# Patient Record
Sex: Male | Born: 1970 | Race: White | Hispanic: Yes | Marital: Married | Smoking: Never smoker
Health system: Southern US, Community
[De-identification: ages and names within clinical notes are randomized; demographics above are authoritative.]

## PROBLEM LIST (undated history)

## (undated) DIAGNOSIS — F419 Anxiety disorder, unspecified: Secondary | ICD-10-CM

## (undated) HISTORY — PX: VASOTOMY: SUR1432

## (undated) HISTORY — DX: Anxiety disorder, unspecified: F41.9

---

## 2008-10-27 ENCOUNTER — Emergency Department (HOSPITAL_COMMUNITY): Admission: EM | Admit: 2008-10-27 | Discharge: 2008-10-27 | Payer: Self-pay | Admitting: Emergency Medicine

## 2016-08-20 ENCOUNTER — Emergency Department (HOSPITAL_COMMUNITY)
Admission: EM | Admit: 2016-08-20 | Discharge: 2016-08-20 | Disposition: A | Discharge status: Home / Self Care | Payer: Self-pay | Attending: Emergency Medicine | Admitting: Emergency Medicine

## 2016-08-20 ENCOUNTER — Encounter (HOSPITAL_COMMUNITY): Payer: Self-pay

## 2016-08-20 ENCOUNTER — Emergency Department (HOSPITAL_COMMUNITY): Payer: Self-pay

## 2016-08-20 DIAGNOSIS — R200 Anesthesia of skin: Secondary | ICD-10-CM | POA: Insufficient documentation

## 2016-08-20 DIAGNOSIS — R0789 Other chest pain: Secondary | ICD-10-CM | POA: Insufficient documentation

## 2016-08-20 DIAGNOSIS — R0602 Shortness of breath: Secondary | ICD-10-CM | POA: Insufficient documentation

## 2016-08-20 LAB — HEPATIC FUNCTION PANEL
ALK PHOS: 54 U/L (ref 38–126)
ALT: 36 U/L (ref 17–63)
AST: 32 U/L (ref 15–41)
Albumin: 4.2 g/dL (ref 3.5–5.0)
TOTAL PROTEIN: 6.8 g/dL (ref 6.5–8.1)
Total Bilirubin: 0.7 mg/dL (ref 0.3–1.2)

## 2016-08-20 LAB — CBC
HEMATOCRIT: 46 % (ref 39.0–52.0)
Hemoglobin: 15.8 g/dL (ref 13.0–17.0)
MCH: 32.3 pg (ref 26.0–34.0)
MCHC: 34.3 g/dL (ref 30.0–36.0)
MCV: 94.1 fL (ref 78.0–100.0)
PLATELETS: 175 10*3/uL (ref 150–400)
RBC: 4.89 MIL/uL (ref 4.22–5.81)
RDW: 12.4 % (ref 11.5–15.5)
WBC: 5.3 10*3/uL (ref 4.0–10.5)

## 2016-08-20 LAB — BASIC METABOLIC PANEL
Anion gap: 8 (ref 5–15)
BUN: 16 mg/dL (ref 6–20)
CHLORIDE: 103 mmol/L (ref 101–111)
CO2: 26 mmol/L (ref 22–32)
CREATININE: 0.96 mg/dL (ref 0.61–1.24)
Calcium: 9.1 mg/dL (ref 8.9–10.3)
GFR calc Af Amer: >60 mL/min (ref >60)
GLUCOSE: 106 mg/dL — AB (ref 65–99)
Potassium: 3.9 mmol/L (ref 3.5–5.1)
SODIUM: 137 mmol/L (ref 135–145)

## 2016-08-20 LAB — LIPASE, BLOOD: LIPASE: 34 U/L (ref 11–51)

## 2016-08-20 LAB — BRAIN NATRIURETIC PEPTIDE: B Natriuretic Peptide: 8.6 pg/mL (ref 0.0–100.0)

## 2016-08-20 LAB — D-DIMER, QUANTITATIVE: D-Dimer, Quant: <0.27 ug/mL-FEU (ref 0.00–0.50)

## 2016-08-20 LAB — TROPONIN I

## 2016-08-20 LAB — I-STAT TROPONIN, ED: Troponin i, poc: 0 ng/mL (ref 0.00–0.08)

## 2016-08-20 NOTE — ED Triage Notes (Signed)
Patient complains of several weeks of several months of facial numbness and chest pain intermittently for 3 weeks. Was seen by his MD and all labs normal per patient. States that the CP is sharp with no radiation. Minimal pain on assessment

## 2016-08-20 NOTE — ED Provider Notes (Signed)
[facility]-EMERGENCY DEPT Provider Note   CSN: [id number] Arrival date & time: 08/20/16  1424  By signing my name below, I, Douglas Stark, attest that this documentation has been prepared under the direction and in the presence of Glynn Octave, MD.  Electronically Signed: Octavia Stark, ED Scribe. 08/20/16. 4:53 PM.    History   Chief Complaint Chief Complaint  Patient presents with  . Chest Pain  . Numbness    The history is provided by the patient. No language interpreter was used.   HPI Comments: Douglas Stark is a 45 y.o. male who presents to the Emergency Department complaining of intermittent, gradual worsening, moderate, non-radiating left sided chest pain x 2 weeks. He notes each episodes lasts from 5-15 minutes. Associated occasional shortness of breath and bilateral facial numbness below his eyes that he has had for ~ 3 years. Pt states that his chest pain is modified when driving or being still. There are no aggravating factors. He was seen by his PCP last week where he had labs done and they came back normal. There are no known injury noted that may have caused his chest pain. Denies diaphoresis, nausea, vomiting, abdominal pain, loss of appetite, numbness or weakness in upper or lower extremities, urinary symptoms, back pain, hx of MI or stents placed, trouble talking, and trouble swallowing. Pt is a non-smoker.  History reviewed. No pertinent past medical history.  There are no active problems to display for this patient.   History reviewed. No pertinent surgical history.     Home Medications    Prior to Admission medications   Not on File    Family History No family history on file.  Social History Social History  Substance Use Topics  . Smoking status: Never Smoker  . Smokeless tobacco: Never Used  . Alcohol use Yes     Allergies   Review of patient's allergies indicates no known allergies.   Review of Systems Review of Systems  A complete 10  system review of systems was obtained and all systems are negative except as noted in the HPI and PMH.   Physical Exam Updated Vital Signs BP 120/60 (BP Location: Left Arm)   Pulse (!) 57   Temp 97.9 F (36.6 C) (Oral)   Resp 18   Ht 5\' 7"  (1.702 m)   Wt 160 lb (72.6 kg)   SpO2 100%   BMI 25.06 kg/m   Physical Exam  Constitutional: He is oriented to person, place, and time. He appears well-developed and well-nourished. No distress.  HENT:  Head: Normocephalic and atraumatic.  Mouth/Throat: Oropharynx is clear and moist. No oropharyngeal exudate.  Eyes: Conjunctivae and EOM are normal. Pupils are equal, round, and reactive to light.  Neck: Normal range of motion. Neck supple.  No meningismus.  Cardiovascular: Normal rate, regular rhythm, normal heart sounds and intact distal pulses.   No murmur heard. Pulmonary/Chest: Effort normal and breath sounds normal. No respiratory distress.  Abdominal: Soft. There is no tenderness. There is no rebound and no guarding.  Musculoskeletal: Normal range of motion. He exhibits tenderness. He exhibits no edema.  Left sided chest tenderness partly reproducible  Neurological: He is alert and oriented to person, place, and time. No cranial nerve deficit. He exhibits normal muscle tone. Coordination normal.  5/5 strength throughout. CN 2-12 intact. Equal radial pulses and grip strength. Subjective sensation intact.  Skin: Skin is warm.  Psychiatric: He has a normal mood and affect. His behavior is normal.  Nursing note and  vitals reviewed.    ED Treatments / Results  DIAGNOSTIC STUDIES: Oxygen Saturation is 100% on RA, normal by my interpretation.  COORDINATION OF CARE:  4:44 PM Discussed treatment plan with pt at bedside and pt agreed to plan.  Labs (all labs ordered are listed, but only abnormal results are displayed) Labs Reviewed  BASIC METABOLIC PANEL - Abnormal; Notable for the following:       Result Value   Glucose, Bld 106 (*)     All other components within normal limits  HEPATIC FUNCTION PANEL - Abnormal; Notable for the following:    Bilirubin, Direct <0.1 (*)    All other components within normal limits  CBC  LIPASE, BLOOD  D-DIMER, QUANTITATIVE (NOT AT [facility])  BRAIN NATRIURETIC PEPTIDE  TROPONIN I  I-STAT TROPOININ, ED    EKG  EKG Interpretation  Date/Time:  Wednesday August 20 2016 14:36:57 EDT Ventricular Rate:  56 PR Interval:  164 QRS Duration: 90 QT Interval:  414 QTC Calculation: 399 R Axis:   98 Text Interpretation:  Sinus bradycardia Rightward axis Borderline ECG No previous ECGs available Confirmed by Manus GunningANCOUR  MD, Avalynne Diver [phone]([id number]) on 08/20/2016 4:43:41 PM       Radiology Dg Chest 2 View  Result Date: 08/20/2016 CLINICAL DATA:  Pt c/o central chest pain and sinus numbness x 3 weeks. No known hx of heart or lung problems. Pt is a nonsmoker. EXAM: CHEST  2 VIEW COMPARISON:  None. FINDINGS: Midline trachea. Normal heart size and mediastinal contours. No pleural effusion or pneumothorax. Clear lungs. IMPRESSION: No acute cardiopulmonary disease. Electronically Signed   By: Jeronimo GreavesKyle  Talbot M.D.   On: 08/20/2016 15:47    Procedures Procedures (including critical care time)  Medications Ordered in ED Medications - No data to display   Initial Impression / Assessment and Plan / ED Course  I have reviewed the triage vital signs and the nursing notes.  Pertinent labs & imaging results that were available during my care of the patient were reviewed by me and considered in my medical decision making (see chart for details).  Clinical Course  Patient with intermittent left-sided chest pain for the past 2 weeks. Pain comes and goes lasting several minutes at a time. Does not radiate. No shortness of breath, nausea, vomiting or diaphoresis. Patient also complains of "numbness under my eyes" intermittently for the past 3 years.  EKG nsr, without acute changes. Troponin negative, chest x-ray negative.  D-dimer negative.  Low suspicion for ACS. Heart score is 1.  Patient's numbness underneath his eyes has been ongoing for 3 years in his bilateral. He is not having any at this time. Not consistent with stroke or TIA.  Troponin negative 2. Low suspicion for ACS. Patient's facial numbness has been ongoing for 3 years and is bilateral. Not Consistent with stroke or TIA. Follow up with cardiology for stress test.  Return precautions discussed.    Final Clinical Impressions(s) / ED Diagnoses   Final diagnoses:  Atypical chest pain   I personally performed the services described in this documentation, which was scribed in my presence. The recorded information has been reviewed and is accurate.   New Prescriptions New Prescriptions   No medications on file     Glynn OctaveStephen Launi Asencio, MD 08/21/16 0005

## 2016-08-20 NOTE — Discharge Instructions (Signed)
There is no evidence of heart attack or blood clot in the lung. Followup with your doctor. You should see the cardiologist for a stress test. Return to the ED if you develop new or worsening symptoms.

## 2020-08-13 ENCOUNTER — Emergency Department (HOSPITAL_COMMUNITY): Payer: Self-pay

## 2020-08-13 ENCOUNTER — Other Ambulatory Visit: Payer: Self-pay

## 2020-08-13 ENCOUNTER — Emergency Department (HOSPITAL_COMMUNITY)
Admission: EM | Admit: 2020-08-13 | Discharge: 2020-08-14 | Disposition: A | Discharge status: Home / Self Care | Payer: Self-pay | Attending: Emergency Medicine | Admitting: Emergency Medicine

## 2020-08-13 ENCOUNTER — Encounter (HOSPITAL_COMMUNITY): Payer: Self-pay

## 2020-08-13 DIAGNOSIS — R519 Headache, unspecified: Secondary | ICD-10-CM | POA: Insufficient documentation

## 2020-08-13 DIAGNOSIS — R002 Palpitations: Secondary | ICD-10-CM | POA: Insufficient documentation

## 2020-08-13 LAB — BASIC METABOLIC PANEL
Anion gap: 10 (ref 5–15)
BUN: 23 mg/dL — ABNORMAL HIGH (ref 6–20)
CO2: 24 mmol/L (ref 22–32)
Calcium: 9.3 mg/dL (ref 8.9–10.3)
Chloride: 105 mmol/L (ref 98–111)
Creatinine, Ser: 1 mg/dL (ref 0.61–1.24)
GFR, Estimated: >60 mL/min (ref >60)
Glucose, Bld: 103 mg/dL — ABNORMAL HIGH (ref 70–99)
Potassium: 3.8 mmol/L (ref 3.5–5.1)
Sodium: 139 mmol/L (ref 135–145)

## 2020-08-13 LAB — CBC
HCT: 43.6 % (ref 39.0–52.0)
Hemoglobin: 14.8 g/dL (ref 13.0–17.0)
MCH: 32.2 pg (ref 26.0–34.0)
MCHC: 33.9 g/dL (ref 30.0–36.0)
MCV: 95 fL (ref 80.0–100.0)
Platelets: 185 10*3/uL (ref 150–400)
RBC: 4.59 MIL/uL (ref 4.22–5.81)
RDW: 11.9 % (ref 11.5–15.5)
WBC: 6.4 10*3/uL (ref 4.0–10.5)
nRBC: 0 % (ref 0.0–0.2)

## 2020-08-13 LAB — TROPONIN I (HIGH SENSITIVITY)
Troponin I (High Sensitivity): 2 ng/L (ref <18)
Troponin I (High Sensitivity): <2 ng/L (ref <18)

## 2020-08-13 NOTE — ED Triage Notes (Signed)
Pt reports that he has chest palpations that woke him up from his sleep along with a headache. Pt reports that this has been going on for a month but tonight it is worse. Denies other cardiac symptoms.

## 2020-08-14 NOTE — Discharge Instructions (Addendum)
You were seen today for palpitations.  Your work-up is reassuring.  Stop taking Benadryl at night to sleep to see if this helps at all.  Follow-up with cardiology for Holter monitoring.  Avoid excessive caffeine.

## 2020-08-14 NOTE — ED Provider Notes (Signed)
[facility] EMERGENCY DEPARTMENT Provider Note   CSN: [id number] Arrival date & time: 08/13/20  0252     History Chief Complaint  Patient presents with  . Chest Pain    Douglas Stark is a 49 y.o. male.  HPI     This is a 49 year old male with no reported past medical history who presents with palpitations and headache.  Patient reports that he awoke yesterday morning from sleep with feeling like his heart was beating fast.  He states that the sensation radiated into his head.  He has had this previously and more frequently over the last month but it was worse yesterday morning.  He did not have any chest pain or shortness of breath at that time.  Patient states that recently he has actually tried to decrease his caffeine use.  However, he does take 100 mg of Benadryl nightly.  He has not previously had a bad reaction to this.  No known history of blood clots, leg swelling.  Denies recent fevers or cough.  Currently he is asymptomatic.  History reviewed. No pertinent past medical history.  There are no problems to display for this patient.   History reviewed. No pertinent surgical history.     No family history on file.  Social History   Tobacco Use  . Smoking status: Never Smoker  . Smokeless tobacco: Never Used  Substance Use Topics  . Alcohol use: Yes  . Drug use: Not on file    Home Medications Prior to Admission medications   Not on File    Allergies    Patient has no known allergies.  Review of Systems   Review of Systems  Constitutional: Negative for fever.  Respiratory: Negative for cough and shortness of breath.   Cardiovascular: Positive for palpitations. Negative for chest pain.  Gastrointestinal: Negative for abdominal pain, nausea and vomiting.  Genitourinary: Negative for dysuria.  Neurological: Positive for headaches. Negative for light-headedness.  All other systems reviewed and are negative.   Physical Exam Updated  Vital Signs BP (!) 119/95   Pulse (!) 56   Temp 97.7 F (36.5 C)   Resp 18   Ht 1.727 m (5\' 8" )   Wt 75.8 kg   SpO2 100%   BMI 25.39 kg/m   Physical Exam Vitals and nursing note reviewed.  Constitutional:      Appearance: He is well-developed.  HENT:     Head: Normocephalic and atraumatic.  Eyes:     Pupils: Pupils are equal, round, and reactive to light.  Cardiovascular:     Rate and Rhythm: Normal rate and regular rhythm.     Heart sounds: Normal heart sounds. No murmur heard.   Pulmonary:     Effort: Pulmonary effort is normal. No respiratory distress.     Breath sounds: Normal breath sounds. No wheezing.  Abdominal:     General: Bowel sounds are normal.     Palpations: Abdomen is soft.     Tenderness: There is no abdominal tenderness. There is no rebound.  Musculoskeletal:     Cervical back: Neck supple.     Right lower leg: No tenderness. No edema.     Left lower leg: No tenderness. No edema.  Lymphadenopathy:     Cervical: No cervical adenopathy.  Skin:    General: Skin is warm and dry.  Neurological:     Mental Status: He is alert and oriented to person, place, and time.  Psychiatric:  Mood and Affect: Mood normal.     ED Results / Procedures / Treatments   Labs (all labs ordered are listed, but only abnormal results are displayed) Labs Reviewed  BASIC METABOLIC PANEL - Abnormal; Notable for the following components:      Result Value   Glucose, Bld 103 (*)    BUN 23 (*)    All other components within normal limits  CBC  TROPONIN I (HIGH SENSITIVITY)  TROPONIN I (HIGH SENSITIVITY)    EKG EKG Interpretation  Date/Time:  Monday August 13 2020 03:05:38 EDT Ventricular Rate:  59 PR Interval:  148 QRS Duration: 78 QT Interval:  390 QTC Calculation: 386 R Axis:   87 Text Interpretation: Sinus bradycardia Otherwise normal ECG Rightward axis When compared with ECG of 08/20/2016, No significant change was found Confirmed by Dione Booze  ([id number]) on 08/13/2020 4:45:17 AM   Radiology DG Chest 2 View  Result Date: 08/13/2020 CLINICAL DATA:  Chest palpitations and headache. Symptoms going on for a month but worse tonight. EXAM: CHEST - 2 VIEW COMPARISON:  08/20/2016 FINDINGS: Heart size and pulmonary vascularity are normal. Calcified granulomas in the right upper lung and right hilum. No airspace disease or consolidation in the lungs. No pleural effusions. No pneumothorax. Mediastinal contours appear intact. IMPRESSION: No active cardiopulmonary disease. Electronically Signed   By: Burman Nieves M.D.   On: 08/13/2020 03:20    Procedures Procedures (including critical care time)  Medications Ordered in ED Medications - No data to display  ED Course  I have reviewed the triage vital signs and the nursing notes.  Pertinent labs & imaging results that were available during my care of the patient were reviewed by me and considered in my medical decision making (see chart for details).    MDM Rules/Calculators/A&P                          Patient presents with palpitations.  Denies associated chest pain or shortness of breath.  He has been in the waiting room for greater than 20 hours.  He is overall nontoxic-appearing and vital signs reviewed.  These are reassuring including blood pressure, temperature, and pulse rate.  EKG shows sinus bradycardia without evidence of arrhythmia or acute ischemia.  Troponin x2 is negative.  Doubt ACS.  Given no shortness of breath, chest pain, or hypoxia, would have low suspicion for PE and he is PERC negative.  This sounds more like palpitations.  Given the doses of Benadryl that he takes at night, he could be having an anticholinergic effect of palpitations.  Low suspicion for infectious etiology and chest x-ray shows no evidence of pneumothorax or pneumonia.  He is currently symptom-free.  I have recommended that he discontinue Benadryl use at night and follow-up with cardiology for Holter  monitoring.  Patient is agreeable to plan.  After history, exam, and medical workup I feel the patient has been appropriately medically screened and is safe for discharge home. Pertinent diagnoses were discussed with the patient. Patient was given return precautions.  Final Clinical Impression(s) / ED Diagnoses Final diagnoses:  Palpitations    Rx / DC Orders ED Discharge Orders    None       Kelsye Loomer, Mayer Masker, MD 08/14/20 [phone]

## 2020-08-31 ENCOUNTER — Other Ambulatory Visit: Payer: Self-pay

## 2020-08-31 ENCOUNTER — Ambulatory Visit (INDEPENDENT_AMBULATORY_CARE_PROVIDER_SITE_OTHER): Payer: Self-pay | Admitting: Cardiology

## 2020-08-31 ENCOUNTER — Encounter: Payer: Self-pay | Admitting: Cardiology

## 2020-08-31 ENCOUNTER — Telehealth: Payer: Self-pay | Admitting: Radiology

## 2020-08-31 VITALS — BP 124/72 | HR 70 | Ht 68.0 in | Wt 167.0 lb

## 2020-08-31 DIAGNOSIS — R002 Palpitations: Secondary | ICD-10-CM

## 2020-08-31 DIAGNOSIS — Z7189 Other specified counseling: Secondary | ICD-10-CM

## 2020-08-31 NOTE — Patient Instructions (Signed)
Medication Instructions:  Your Physician recommend you continue on your current medication as directed.    *If you need a refill on your cardiac medications before your next appointment, please call your pharmacy*   Lab Work: None   Testing/Procedures: Our physician has recommended that you wear an 7  DAY ZIO-PATCH monitor. The Zio patch cardiac monitor continuously records heart rhythm data for up to 14 days, this is for patients being evaluated for multiple types heart rhythms. For the first 24 hours post application, please avoid getting the Zio monitor wet in the shower or by excessive sweating during exercise. After that, feel free to carry on with regular activities. Keep soaps and lotions away from the ZIO XT Patch.   Someone from our office will call to verify address and mail monitor.    Follow-Up: At [facility], you and your health needs are our priority.  As part of our continuing mission to provide you with exceptional heart care, we have created designated Provider Care Teams.  These Care Teams include your primary Cardiologist (physician) and Advanced Practice Providers (APPs -  Physician Assistants and Nurse Practitioners) who all work together to provide you with the care you need, when you need it.  We recommend signing up for the patient portal called "MyChart".  Sign up information is provided on this After Visit Summary.  MyChart is used to connect with patients for Virtual Visits (Telemedicine).  Patients are able to view lab/test results, encounter notes, upcoming appointments, etc.  Non-urgent messages can be sent to your provider as well.   To learn more about what you can do with MyChart, go to [email or web address].    Your next appointment:   As needed  The format for your next appointment:   In Person  Provider:   Bridgette Christopher, MD  ZIO XT- Long Term Monitor Instructions   Your physician has requested you wear your ZIO patch  monitor__7_____days.   This is a single patch monitor.  [employer] supplies one patch monitor per enrollment.  Additional stickers are not available.   Please do not apply patch if you will be having a Nuclear Stress Test, Echocardiogram, Cardiac CT, MRI, or Chest Xray during the time frame you would be wearing the monitor. The patch cannot be worn during these tests.  You cannot remove and re-apply the ZIO XT patch monitor.   Your ZIO patch monitor will be sent USPS Priority mail from [employer] directly to your home address. The monitor may also be mailed to a PO BOX if home delivery is not available.   It may take 3-5 days to receive your monitor after you have been enrolled.   Once you have received you monitor, please review enclosed instructions.  Your monitor has already been registered assigning a specific monitor serial # to you.   Applying the monitor   Shave hair from upper left chest.   Hold abrader disc by orange tab.  Rub abrader in 40 strokes over left upper chest as indicated in your monitor instructions.   Clean area with 4 enclosed alcohol pads .  Use all pads to assure are is cleaned thoroughly.  Let dry.   Apply patch as indicated in monitor instructions.  Patch will be place under collarbone on left side of chest with arrow pointing upward.   Rub patch adhesive wings for 2 minutes.Remove white label marked "1".  Remove white label marked "2".  Rub patch adhesive wings for 2 additional minutes.     While looking in a mirror, press and release button in center of patch.  A small green light will flash 3-4 times .  This will be your only indicator the monitor has been turned on.     Do not shower for the first 24 hours.  You may shower after the first 24 hours.   Press button if you feel a symptom. You will hear a small click.  Record Date, Time and Symptom in the Patient Log Book.   When you are ready to remove patch, follow instructions on last 2 pages of Patient  Log Book.  Stick patch monitor onto last page of Patient Log Book.   Place Patient Log Book in Blue box.  Use locking tab on box and tape box closed securely.  The Orange and White box has prepaid postage on it.  Please place in mailbox as soon as possible.  Your physician should have your test results approximately 7 days after the monitor has been mailed back to [employer].   Call [employer] Customer Care at [phone] if you have questions regarding your ZIO XT patch monitor.  Call them immediately if you see an orange light blinking on your monitor.   If your monitor falls off in less than 4 days contact our Monitor department at [phone].  If your monitor becomes loose or falls off after 4 days call [employer] at [phone] for suggestions on securing your monitor.    

## 2020-08-31 NOTE — Progress Notes (Signed)
Cardiology Office Note:    Date:  08/31/2020   ID:  Douglas Stark, DOB [birth year], MRN [id number]  PCP:  Patient, No Pcp Per  Cardiologist:  Douglas Red, MD  Referring MD: Dr. Ross Marcus, ER  CC: new patient evaluation for palpitations  History of Present Illness:    Douglas Stark is a 49 y.o. male without significant PMH who is seen as a new consult at the request of Dr. Ross Marcus for the evaluation and management of palpitations.  ER note dated 08/14/20 reviewed. Presented with palpitations, headache  Tachycardia/palpitations: -Initial onset: several months ago, more severe recently.  -Frequency/Duration: intermittent, can be every other day or go several days without issues. Can last an entire day or sometimes longer -Associated symptoms:  Can feel a little dizzy, feels like his chest is shaking. No nausea, shortness of breath, diaphoresis -Aggravating/alleviating factors: notices that he can feel it coming on. Doesn't think the medication is helping; takes occasional benadryl to sleep. Notices more when he takes the beadryl -Syncope/near syncope: none -Prior cardiac history: none -Prior workup: none -Prior treatment: none -Possible medication interactions: none -Caffeine: has cut back -Alcohol: on occasion -Tobacco: former, was a light smoker. None in about 14 years -OTC supplements: as above, had been using PRN benadryl. Used to take 1/2 caffeine pill before he goes to the gym, no longer taking. -Comorbidities: none -Exercise level: goes to gym 4-6 days/week.  -Labs: kidney function/electrolytes, CBC reviewed. -Cardiac ROS: no chest pain, no shortness of breath, no PND, no orthopnea, no LE edema. -Family history: none that he is aware of  No past medical history on file.  No past surgical history on file.  Current Medications: Current Outpatient Medications on File Prior to Visit  Medication Sig  . hydrOXYzine (ATARAX/VISTARIL) 50 MG tablet  Take 50 mg by mouth 2 (two) times daily.   No current facility-administered medications on file prior to visit.     Allergies:   Patient has no known allergies.   Social History   Tobacco Use  . Smoking status: Never Smoker  . Smokeless tobacco: Never Used  Substance Use Topics  . Alcohol use: Yes  . Drug use: Not on file    Family History: No CV family history that he is aware of  ROS:   Please see the history of present illness.  Additional pertinent ROS: Constitutional: Negative for chills, fever, night sweats, unintentional weight loss  HENT: Negative for ear pain and hearing loss.   Eyes: Negative for loss of vision and eye pain.  Respiratory: Negative for cough, sputum, wheezing.   Cardiovascular: See HPI. Gastrointestinal: Negative for abdominal pain, melena, and hematochezia.  Genitourinary: Negative for dysuria and hematuria.  Musculoskeletal: Negative for falls and myalgias.  Skin: Negative for itching and rash.  Neurological: Negative for focal weakness, focal sensory changes and loss of consciousness.  Endo/Heme/Allergies: Does not bruise/bleed easily.     EKGs/Labs/Other Studies Reviewed:    The following studies were reviewed today: No prior cardiac studies  EKG:  EKG is personally reviewed.  The ekg ordered today demonstrates NSR at 70 bpm  Recent Labs: 08/13/2020: BUN 23; Creatinine, Ser 1.00; Hemoglobin 14.8; Platelets 185; Potassium 3.8; Sodium 139  Recent Lipid Panel No results found for: CHOL, TRIG, HDL, CHOLHDL, VLDL, LDLCALC, LDLDIRECT  Physical Exam:    VS:  BP 124/72   Pulse 70   Ht 5\' 8"  (1.727 m)   Wt 167 lb (75.8 kg)   SpO2 97%   BMI  25.39 kg/m     Wt Readings from Last 3 Encounters:  08/31/20 167 lb (75.8 kg)  08/13/20 167 lb (75.8 kg)  08/20/16 160 lb (72.6 kg)    GEN: Well nourished, well developed in no acute distress HEENT: Normal, moist mucous membranes NECK: No JVD CARDIAC: regular rhythm, normal S1 and S2, no rubs or  gallops. No murmurs. VASCULAR: Radial and DP pulses 2+ bilaterally. No carotid bruits RESPIRATORY:  Clear to auscultation without rales, wheezing or rhonchi  ABDOMEN: Soft, non-tender, non-distended MUSCULOSKELETAL:  Ambulates independently SKIN: Warm and dry, no edema NEUROLOGIC:  Alert and oriented x 3. No focal neuro deficits noted. PSYCHIATRIC:  Normal affect    ASSESSMENT:    1. Palpitation   2. Cardiac risk counseling   3. Counseling on health promotion and disease prevention    PLAN:    Palpitations -we discussed the potential causes of fast heart rates and palpitations today. Reviewed the normal electrical system of the heart. Reviewed the role of the sinus node. Reviewed the balance between resting (vagal) tone and fight or flight nervous system input. Reviewed how exercise improves vagal tone and lowers resting heart rate. Reviewed that sinus tachycardia, or elevated sinus rate, is usually secondary to something else in the body. This can include pain, stress, infection, anxiety, hormone imbalance, low blood counts, etc. Discussed that we do not typically treat sinus tachycardia by itself, and instead the focus is on finding what is driving the heart rate and treating that. We discussed that there can be other rhythm issues, from either the top or bottom of the heart, that are abnormal rhythms. Discussed how we evaluate for these. -after shared decision making, will purse 7 day Zio  Cardiac risk counseling and prevention recommendations: -recommend heart healthy/Mediterranean diet, with whole grains, fruits, vegetable, fish, lean meats, nuts, and olive oil. Limit salt. -recommend moderate walking, 3-5 times/week for 30-50 minutes each session. Aim for at least 150 minutes.week. Goal should be pace of 3 miles/hours, or walking 1.5 miles in 30 minutes -recommend avoidance of tobacco products. Avoid excess alcohol.:  -ASCVD risk score: The ASCVD Risk score Denman George(Goff DC Jr., et al., 2013)  failed to calculate for the following reasons:   Cannot find a previous HDL lab   Cannot find a previous total cholesterol lab    Plan for follow up: to be determined based on results of monitor  Douglas RedBridgette Lakitha Gordy, MD, PhD [facility]    Medication Adjustments/Labs and Tests Ordered: Current medicines are reviewed at length with the patient today.  Concerns regarding medicines are outlined above.  Orders Placed This Encounter  Procedures  . LONG TERM MONITOR (3-14 DAYS)  . EKG 12-Lead   No orders of the defined types were placed in this encounter.   Patient Instructions  Medication Instructions:  Your Physician recommend you continue on your current medication as directed.    *If you need a refill on your cardiac medications before your next appointment, please call your pharmacy*   Lab Work: None   Testing/Procedures: Our physician has recommended that you wear an 7  DAY ZIO-PATCH monitor. The Zio patch cardiac monitor continuously records heart rhythm data for up to 14 days, this is for patients being evaluated for multiple types heart rhythms. For the first 24 hours post application, please avoid getting the Zio monitor wet in the shower or by excessive sweating during exercise. After that, feel free to carry on with regular activities. Keep soaps and lotions  away from the ZIO XT Patch.   Someone from our office will call to verify address and mail monitor.     Follow-Up: At [facility], you and your health needs are our priority.  As part of our continuing mission to provide you with exceptional heart care, we have created designated Provider Care Teams.  These Care Teams include your primary Cardiologist (physician) and Advanced Practice Providers (APPs -  Physician Assistants and Nurse Practitioners) who all work together to provide you with the care you need, when you need it.  We recommend signing up for the patient portal called "MyChart".   Sign up information is provided on this After Visit Summary.  MyChart is used to connect with patients for Virtual Visits (Telemedicine).  Patients are able to view lab/test results, encounter notes, upcoming appointments, etc.  Non-urgent messages can be sent to your provider as well.   To learn more about what you can do with MyChart, go to [email or web address].    Your next appointment:   As needed  The format for your next appointment:   In Person  Provider:   Jodelle Red, MD  Christena Deem- Long Term Monitor Instructions   Your physician has requested you wear your ZIO patch monitor___7____days.   This is a single patch monitor.  [employer] supplies one patch monitor per enrollment.  Additional stickers are not available.   Please do not apply patch if you will be having a Nuclear Stress Test, Echocardiogram, Cardiac CT, MRI, or Chest Xray during the time frame you would be wearing the monitor. The patch cannot be worn during these tests.  You cannot remove and re-apply the ZIO XT patch monitor.   Your ZIO patch monitor will be sent USPS Priority mail from [facility] directly to your home address. The monitor may also be mailed to a PO BOX if home delivery is not available.   It may take 3-5 days to receive your monitor after you have been enrolled.   Once you have received you monitor, please review enclosed instructions.  Your monitor has already been registered assigning a specific monitor serial # to you.   Applying the monitor   Shave hair from upper left chest.   Hold abrader disc by orange tab.  Rub abrader in 40 strokes over left upper chest as indicated in your monitor instructions.   Clean area with 4 enclosed alcohol pads .  Use all pads to assure are is cleaned thoroughly.  Let dry.   Apply patch as indicated in monitor instructions.  Patch will be place under collarbone on left side of chest with arrow pointing upward.   Rub patch adhesive wings for 2  minutes.Remove white label marked "1".  Remove white label marked "2".  Rub patch adhesive wings for 2 additional minutes.   While looking in a mirror, press and release button in center of patch.  A small green light will flash 3-4 times .  This will be your only indicator the monitor has been turned on.     Do not shower for the first 24 hours.  You may shower after the first 24 hours.   Press button if you feel a symptom. You will hear a small click.  Record Date, Time and Symptom in the Patient Log Book.   When you are ready to remove patch, follow instructions on last 2 pages of Patient Log Book.  Stick patch monitor onto last page of Patient Log Book.   Place  Patient Log Book in [facility] box.  Use locking tab on box and tape box closed securely.  The [employer] and [employer] has [employer] on it.  Please place in mailbox as soon as possible.  Your physician should have your test results approximately 7 days after the monitor has been mailed back to [facility].   Call [facility] Customer Care at [phone] if you have questions regarding your ZIO XT patch monitor.  Call them immediately if you see an orange light blinking on your monitor.   If your monitor falls off in less than 4 days contact our Monitor department at [phone].  If your monitor becomes loose or falls off after 4 days call [employer] at [phone] for suggestions on securing your monitor.     Signed, Douglas Red, MD PhD 08/31/2020  [facility]

## 2020-08-31 NOTE — Telephone Encounter (Signed)
Enrolled patient for a 7 day Zio XT monitor to be mailed to patients home.  

## 2020-09-07 ENCOUNTER — Ambulatory Visit (INDEPENDENT_AMBULATORY_CARE_PROVIDER_SITE_OTHER): Payer: Self-pay

## 2020-09-07 DIAGNOSIS — R002 Palpitations: Secondary | ICD-10-CM

## 2020-11-08 ENCOUNTER — Encounter: Payer: Self-pay | Admitting: Cardiology

## 2021-12-18 IMAGING — CR DG CHEST 2V
2 series · 2 of 2 positions shown · non-contrast
Comparison: 08/20/2016

CLINICAL DATA: Chest palpitations and headache. Symptoms going on
for a month but worse tonight.

EXAM:
CHEST - 2 VIEW

[chest pa]
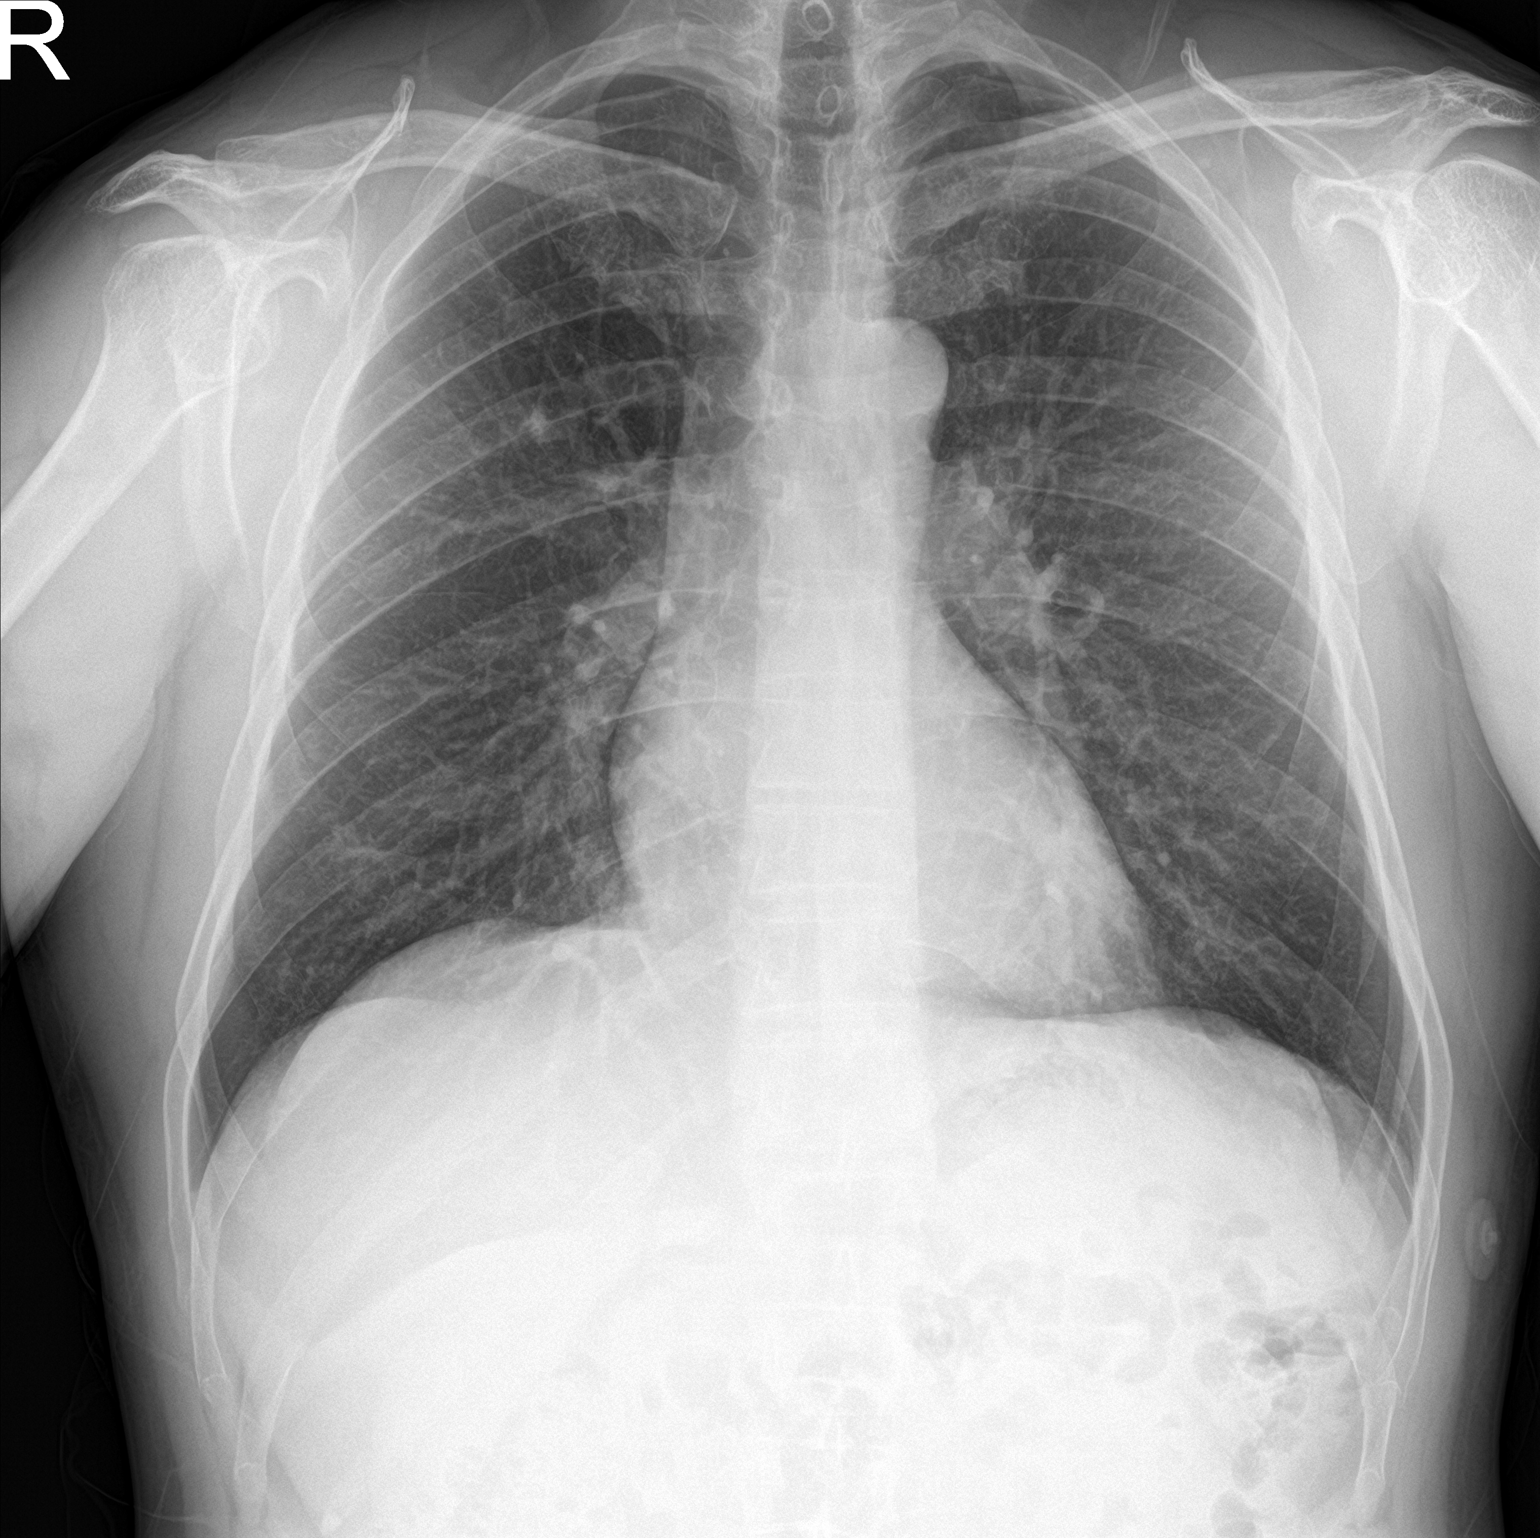

[chest lat]
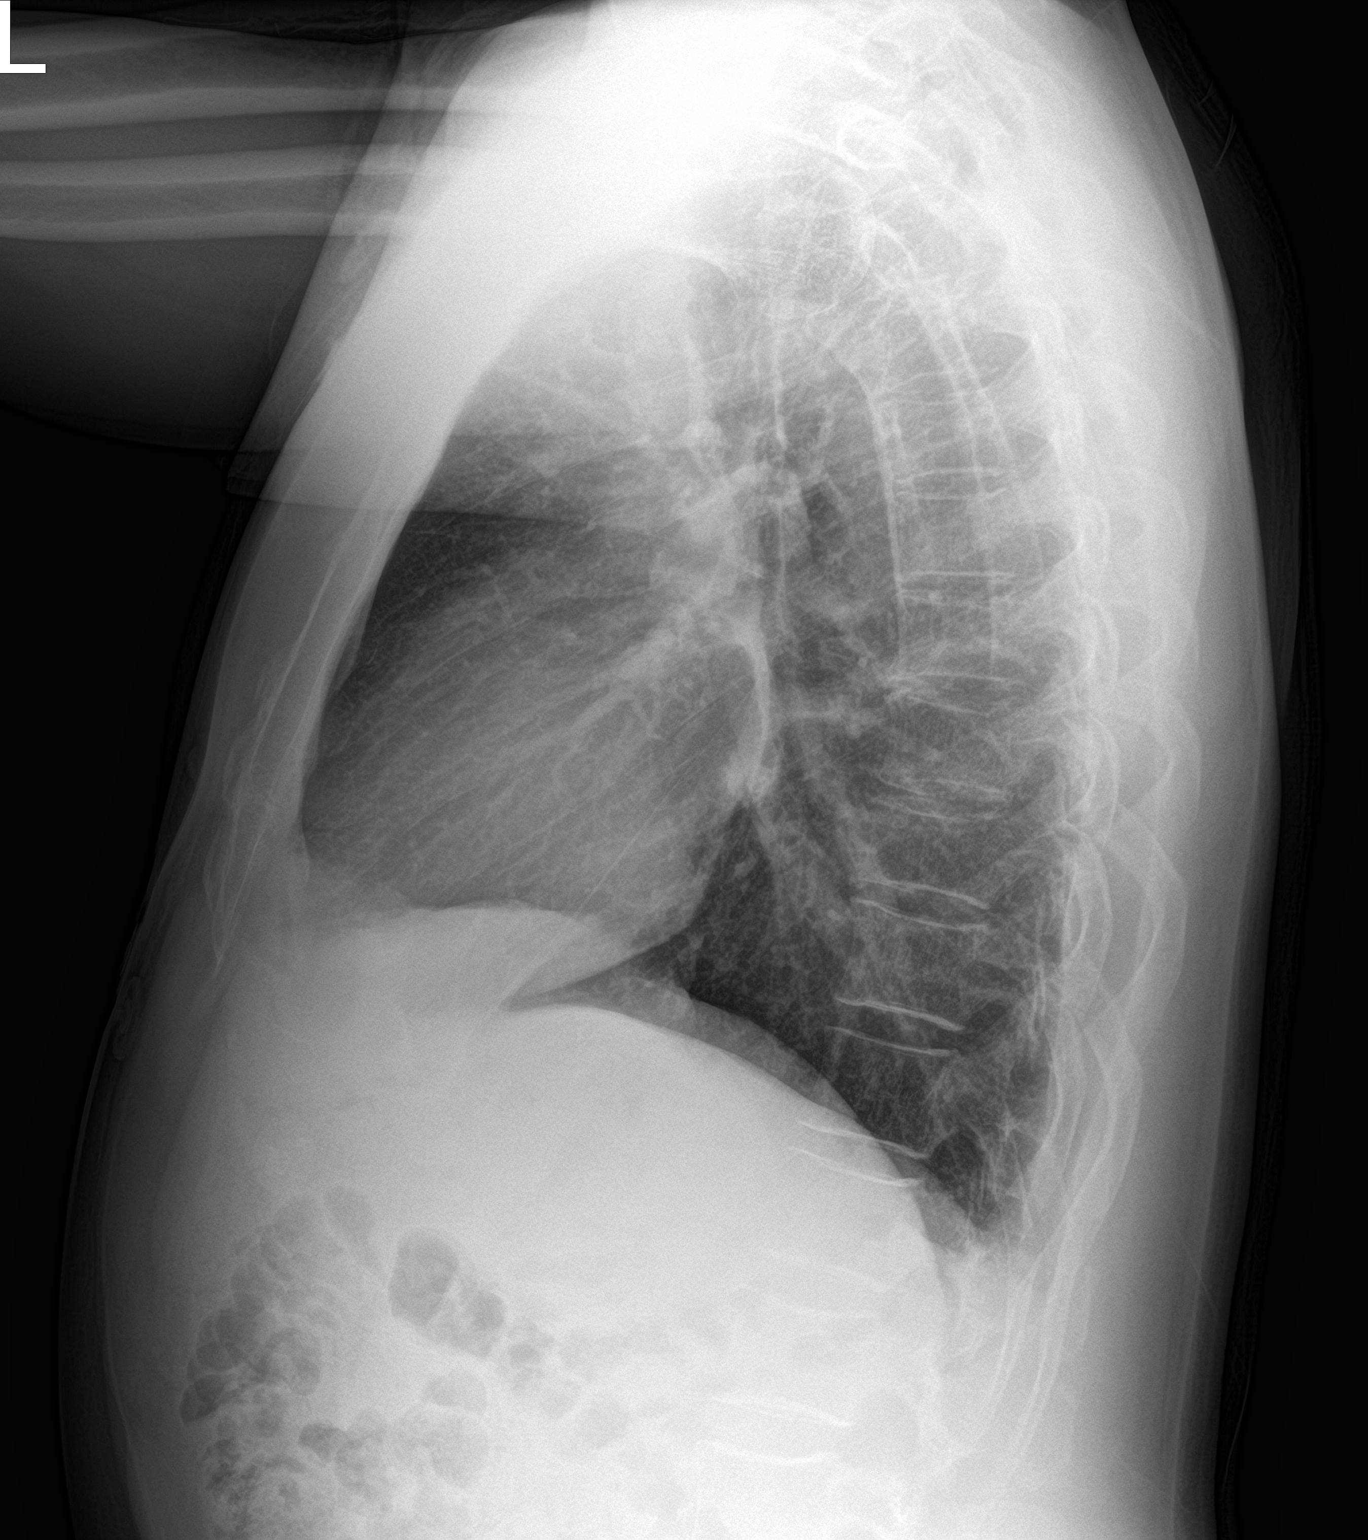

[2 of 2 positions shown; findings below may reference images not displayed]

FINDINGS: Heart size and pulmonary vascularity are normal. Calcified
granulomas in the right upper lung and right hilum. No airspace
disease or consolidation in the lungs. No pleural effusions. No
pneumothorax. Mediastinal contours appear intact.
IMPRESSION: No active cardiopulmonary disease.

## 2023-05-20 ENCOUNTER — Encounter: Payer: Self-pay | Admitting: Gastroenterology

## 2023-05-20 ENCOUNTER — Ambulatory Visit: Payer: Self-pay | Admitting: Gastroenterology

## 2023-05-20 VITALS — BP 118/68 | HR 68 | Ht 68.0 in | Wt 175.0 lb

## 2023-05-20 DIAGNOSIS — R1012 Left upper quadrant pain: Secondary | ICD-10-CM

## 2023-05-20 DIAGNOSIS — Z1211 Encounter for screening for malignant neoplasm of colon: Secondary | ICD-10-CM

## 2023-05-20 NOTE — Patient Instructions (Signed)
_______________________________________________________  If your blood pressure at your visit was 140/90 or greater, please contact your primary care physician to follow up on this.  _______________________________________________________  If you are age 52 or older, your body mass index should be between 23-30. Your Body mass index is 26.61 kg/m. If this is out of the aforementioned range listed, please consider follow up with your Primary Care Provider.  If you are age 52 or younger, your body mass index should be between 19-25. Your Body mass index is 26.61 kg/m. If this is out of the aformentioned range listed, please consider follow up with your Primary Care Provider.   ________________________________________________________  The [facility] providers would like to encourage you to use [facility] to communicate with providers for non-urgent requests or questions.  Due to long hold times on the telephone, sending your provider a message by [facility] may be a faster and more efficient way to get a response.  Please allow 52 business hours for a response.  Please remember that this is for non-urgent requests.  _______________________________________________________  We have given you samples of the following medication to take: Clenpiq  Your provider has requested that you go to the basement level for lab work before leaving today. Press "B" on the elevator. The lab is located at the first door on the left as you exit the elevator.  You have been scheduled for a colonoscopy. Please follow written instructions given to you at your visit today.   Please pick up your prep supplies at the pharmacy within the next 1-3 days.  If you use inhalers (even only as needed), please bring them with you on the day of your procedure.  DO NOT TAKE 7 DAYS PRIOR TO TEST- Trulicity (dulaglutide) Ozempic, Wegovy (semaglutide) Mounjaro (tirzepatide) Bydureon Bcise (exanatide extended release)  DO NOT TAKE 1  DAY PRIOR TO YOUR TEST Rybelsus (semaglutide) Adlyxin (lixisenatide) Victoza (liraglutide) Byetta (exanatide) ___________________________________________________________________________  You have been scheduled for a CT scan of the abdomen and pelvis at [facility] [place], [place], [place] [address]).   You are scheduled on 05-28-2023 at 5pm. You should arrive by 245pm for your appointment time for registration. Please follow the written instructions below on the day of your exam:  WARNING: IF YOU ARE ALLERGIC TO IODINE/X-RAY DYE, PLEASE NOTIFY RADIOLOGY IMMEDIATELY AT [phone]! YOU WILL BE GIVEN A 13 HOUR PREMEDICATION PREP.  1) Do not eat or drink anything after 1pm (4 hours prior to your test) 2) You have been given 2 bottles of oral contrast to drink. The solution may taste better if refrigerated, but do NOT add ice or any other liquid to this solution. Shake well before drinking.    Drink 1 bottle of contrast @ 3pm (2 hours prior to your exam)  Drink 1 bottle of contrast @ 4pm (1 hour prior to your exam)  You may take any medications as prescribed with a small amount of water, if necessary. If you take any of the following medications: METFORMIN, GLUCOPHAGE, GLUCOVANCE, AVANDAMET, RIOMET, FORTAMET, ACTOPLUS MET, JANUMET, GLUMETZA or METAGLIP, you MAY be asked to HOLD this medication 48 hours AFTER the exam.  The purpose of you drinking the oral contrast is to aid in the visualization of your intestinal tract. The contrast solution may cause some diarrhea. Depending on your individual set of symptoms, you may also receive an intravenous injection of x-ray contrast/dye. Plan on being at [facility] for 30 minutes or longer, depending on the type of  exam you are having performed.  This test typically takes 30-45 minutes to complete.  If you have any questions regarding your exam or if you need to reschedule, you may call the CT department at [phone]  between the hours of 8:00 am and 5:00 pm, Monday-Friday.  ________________________________________________________________________  Thank you,  Dr. Lynann Bologna

## 2023-05-20 NOTE — Progress Notes (Signed)
Chief Complaint: Abdo pain  Referring Provider:  Sandre Kitty, PA-C      ASSESSMENT AND PLAN;   #1. LUQ pain- ?etiology  #2. CRC screening  Plan: -CBC, CMP, lipase -CT AP with contrast -Colon    Discussed risks & benefits of colonoscopy. Risks including rare perforation req laparotomy, bleeding after bx/polypectomy req blood transfusion, rarely missing neoplasms, risks of anesthesia/sedation, rare risk of damage to internal organs. Benefits outweigh the risks. Patient agrees to proceed. All the questions were answered. Pt consents to proceed. HPI:    Douglas Stark is a 52 y.o. male   LUQ pain x 3-4 yrs, intermittent, at times sharp Getting worse Occ worse after eating but not always Not always relieved by Bms Gets worse towards the end of the day Better after lying down Worst with exercise  No N/V Used to hearburn-resolved when he stopped spicy foods He denies having any odynophagia or dysphagia.  No diarrhea or constipation.  No fever chills or night sweats.  No recent weight loss.  He does exercise and works out almost every day.  Denies having any definite food intolerance except spicy foods  No sodas, chocolates, chewing gums, artificial sweeteners and candy. 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  No alcohol or smoking     History reviewed. No pertinent past medical history.  Past Surgical History:  Procedure Laterality Date   VASOTOMY      Family History  Problem Relation Age of Onset   Liver disease Neg Hx    Esophageal cancer Neg Hx    Colon cancer Neg Hx     Social History   Tobacco Use   Smoking status: Never   Smokeless tobacco: Never  Substance Use Topics   Alcohol use: Yes    Alcohol/week: 1.0 standard drink of alcohol    Types: 1 Cans of beer per week    Comment: a day    Current Outpatient Medications  Medication Sig Dispense Refill   hydrOXYzine (ATARAX/VISTARIL) 50 MG tablet Take 50 mg by mouth 2  (two) times daily. (Patient not taking: Reported on 05/20/2023)     No current facility-administered medications for this visit.    No Known Allergies  Review of Systems:  Constitutional: Denies fever, chills, diaphoresis, appetite change and fatigue.  HEENT: Denies photophobia, eye pain, redness, hearing loss, ear pain, congestion, sore throat, rhinorrhea, sneezing, mouth sores, neck pain, neck stiffness and tinnitus.   Respiratory: Denies SOB, DOE, cough, chest tightness,  and wheezing.   Cardiovascular: Denies chest pain, palpitations and leg swelling.  Genitourinary: Denies dysuria, urgency, frequency, hematuria, flank pain and difficulty urinating.  Musculoskeletal: Denies myalgias, back pain, joint swelling, arthralgias and gait problem.  Skin: No rash.  Neurological: Denies dizziness, seizures, syncope, weakness, light-headedness, numbness and headaches.  Hematological: Denies adenopathy. Easy bruising, personal or family bleeding history  Psychiatric/Behavioral: No anxiety or depression     Physical Exam:    BP 118/68   Pulse 68   Ht 5\' 8"  (1.727 m)   Wt 175 lb (79.4 kg)   BMI 26.61 kg/m  Wt Readings from Last 3 Encounters:  05/20/23 175 lb (79.4 kg)  08/31/20 167 lb (75.8 kg)  08/13/20 167 lb (75.8 kg)   Constitutional:  Well-developed, in no acute distress. Psychiatric: Normal mood and affect. Behavior is normal. HEENT: Pupils normal.  Conjunctivae are normal. No scleral icterus. Neck supple.  Cardiovascular: Normal rate, regular rhythm. No edema Pulmonary/chest: Effort normal and breath  sounds normal. No wheezing, rales or rhonchi. Abdominal: Soft, nondistended. Mild LUQ tenderness without rebound.  Bowel sounds active throughout. There are no masses palpable. No hepatomegaly. Rectal: Deferred Neurological: Alert and oriented to person place and time. Skin: Skin is warm and dry. No rashes noted.  Data Reviewed: I have personally reviewed following labs and  imaging studies  CBC:    Latest Ref Rng & Units 08/13/2020    3:08 AM 08/20/2016    2:39 PM  CBC  WBC 4.0 - 10.5 K/uL 6.4  5.3   Hemoglobin 13.0 - 17.0 g/dL 40.9  81.1   Hematocrit 39.0 - 52.0 % 43.6  46.0   Platelets 150 - 400 K/uL 185  175     CMP:    Latest Ref Rng & Units 08/13/2020    3:08 AM 08/20/2016    5:04 PM 08/20/2016    2:39 PM  CMP  Glucose 70 - 99 mg/dL 914   782   BUN 6 - 20 mg/dL 23   16   Creatinine 9.56 - 1.24 mg/dL 2.13   0.86   Sodium 578 - 145 mmol/L 139   137   Potassium 3.5 - 5.1 mmol/L 3.8   3.9   Chloride 98 - 111 mmol/L 105   103   CO2 22 - 32 mmol/L 24   26   Calcium 8.9 - 10.3 mg/dL 9.3   9.1   Total Protein 6.5 - 8.1 g/dL  6.8    Total Bilirubin 0.3 - 1.2 mg/dL  0.7    Alkaline Phos 38 - 126 U/L  54    AST 15 - 41 U/L  32    ALT 17 - 63 U/L  36          [name], MD 05/20/2023, 10:08 AM  Cc: Sandre Kitty, PA-C

## 2023-05-28 ENCOUNTER — Ambulatory Visit (HOSPITAL_COMMUNITY): Payer: Self-pay

## 2023-06-04 ENCOUNTER — Ambulatory Visit (HOSPITAL_COMMUNITY)
Admission: RE | Admit: 2023-06-04 | Discharge: 2023-06-04 | Disposition: A | Discharge status: Home / Self Care | Payer: Self-pay | Source: Ambulatory Visit | Attending: Gastroenterology | Admitting: Gastroenterology

## 2023-06-04 DIAGNOSIS — R1012 Left upper quadrant pain: Secondary | ICD-10-CM | POA: Insufficient documentation

## 2023-06-04 MED ORDER — IOHEXOL 300 MG/ML  SOLN
100.0000 mL | Freq: Once | INTRAMUSCULAR | Status: AC | PRN
  Administered 2023-06-04: 100 mL via INTRAVENOUS

## 2023-06-04 MED ORDER — IOHEXOL 9 MG/ML PO SOLN
1000.0000 mL | Freq: Once | ORAL | Status: AC
  Administered 2023-06-04: 1000 mL via ORAL

## 2023-06-04 MED ORDER — SODIUM CHLORIDE (PF) 0.9 % IJ SOLN
INTRAMUSCULAR | Status: AC
  Filled 2023-06-04: qty 50

## 2023-06-26 ENCOUNTER — Other Ambulatory Visit (INDEPENDENT_AMBULATORY_CARE_PROVIDER_SITE_OTHER): Payer: Self-pay

## 2023-06-26 DIAGNOSIS — Z1212 Encounter for screening for malignant neoplasm of rectum: Secondary | ICD-10-CM

## 2023-06-26 DIAGNOSIS — Z1211 Encounter for screening for malignant neoplasm of colon: Secondary | ICD-10-CM

## 2023-06-26 DIAGNOSIS — R1012 Left upper quadrant pain: Secondary | ICD-10-CM

## 2023-06-26 LAB — COMPREHENSIVE METABOLIC PANEL
ALT: 15 U/L (ref 0–53)
AST: 16 U/L (ref 0–37)
Albumin: 4.1 g/dL (ref 3.5–5.2)
Alkaline Phosphatase: 26 U/L — ABNORMAL LOW (ref 39–117)
BUN: 17 mg/dL (ref 6–23)
CO2: 27 mEq/L (ref 19–32)
Calcium: 8.9 mg/dL (ref 8.4–10.5)
Chloride: 105 mEq/L (ref 96–112)
Creatinine, Ser: 1.05 mg/dL (ref 0.40–1.50)
GFR: 82.06 mL/min (ref >60.00)
Glucose, Bld: 96 mg/dL (ref 70–99)
Potassium: 3.8 mEq/L (ref 3.5–5.1)
Sodium: 138 mEq/L (ref 135–145)
Total Bilirubin: 0.5 mg/dL (ref 0.2–1.2)
Total Protein: 6.6 g/dL (ref 6.0–8.3)

## 2023-06-26 LAB — LIPASE: Lipase: 23 U/L (ref 11.0–59.0)

## 2023-06-26 LAB — CBC WITH DIFFERENTIAL/PLATELET
Basophils Absolute: 0.1 10*3/uL (ref 0.0–0.1)
Basophils Relative: 1.6 % (ref 0.0–3.0)
Eosinophils Absolute: 0.2 10*3/uL (ref 0.0–0.7)
Eosinophils Relative: 4.2 % (ref 0.0–5.0)
HCT: 47.6 % (ref 39.0–52.0)
Hemoglobin: 15.7 g/dL (ref 13.0–17.0)
Lymphocytes Relative: 42.4 % (ref 12.0–46.0)
Lymphs Abs: 2.2 10*3/uL (ref 0.7–4.0)
MCHC: 33 g/dL (ref 30.0–36.0)
MCV: 96.5 fl (ref 78.0–100.0)
Monocytes Absolute: 0.4 10*3/uL (ref 0.1–1.0)
Monocytes Relative: 7.8 % (ref 3.0–12.0)
Neutro Abs: 2.3 10*3/uL (ref 1.4–7.7)
Neutrophils Relative %: 44 % (ref 43.0–77.0)
Platelets: 184 10*3/uL (ref 150.0–400.0)
RBC: 4.94 Mil/uL (ref 4.22–5.81)
RDW: 13.5 % (ref 11.5–15.5)
WBC: 5.3 10*3/uL (ref 4.0–10.5)

## 2023-07-29 ENCOUNTER — Ambulatory Visit (AMBULATORY_SURGERY_CENTER): Payer: Self-pay | Admitting: Gastroenterology

## 2023-07-29 ENCOUNTER — Encounter: Payer: Self-pay | Admitting: Gastroenterology

## 2023-07-29 VITALS — BP 129/73 | HR 72 | Temp 98.1°F | Resp 16 | Ht 68.0 in | Wt 175.0 lb

## 2023-07-29 DIAGNOSIS — D125 Benign neoplasm of sigmoid colon: Secondary | ICD-10-CM

## 2023-07-29 DIAGNOSIS — Z1211 Encounter for screening for malignant neoplasm of colon: Secondary | ICD-10-CM

## 2023-07-29 MED ORDER — SODIUM CHLORIDE 0.9 % IV SOLN: 500.0000 mL | Freq: Once | INTRAVENOUS | Status: DC

## 2023-07-29 NOTE — Progress Notes (Signed)
Called to room to assist during endoscopic procedure.  Patient ID and intended procedure confirmed with present staff. Received instructions for my participation in the procedure from the performing physician.  

## 2023-07-29 NOTE — Op Note (Signed)
[facility] Patient Name: Douglas Stark Procedure Date: 07/29/2023 11:22 AM MRN: [id number] Endoscopist: Lynann Bologna , MD, [id number] Age: 52 Referring MD:  Date of Birth: 1971-09-14 Gender: Male Account #: [id number] Procedure:                Colonoscopy Indications:              Screening for colorectal malignant neoplasm Medicines:                Monitored Anesthesia Care Procedure:                Pre-Anesthesia Assessment:                           - Prior to the procedure, a History and Physical                            was performed, and patient medications and                            allergies were reviewed. The patient's tolerance of                            previous anesthesia was also reviewed. The risks                            and benefits of the procedure and the sedation                            options and risks were discussed with the patient.                            All questions were answered, and informed consent                            was obtained. Prior Anticoagulants: The patient has                            taken no anticoagulant or antiplatelet agents. ASA                            Grade Assessment: II - A patient with mild systemic                            disease. After reviewing the risks and benefits,                            the patient was deemed in satisfactory condition to                            undergo the procedure.                           After obtaining informed consent, the colonoscope  was passed under direct vision. Throughout the                            procedure, the patient's blood pressure, pulse, and                            oxygen saturations were monitored continuously. The                            PCF-HQ190L Colonoscope [id number] was introduced                            through the anus and advanced to the 2 cm into the                            ileum. The  colonoscopy was performed without                            difficulty. The patient tolerated the procedure                            well. The quality of the bowel preparation was                            adequate to identify polyps. The terminal ileum,                            ileocecal valve, appendiceal orifice, and rectum                            were photographed. Scope In: 11:30:10 AM Scope Out: 11:50:11 AM Scope Withdrawal Time: 0 hours 13 minutes 20 seconds  Total Procedure Duration: 0 hours 20 minutes 1 second  Findings:                 An 8 mm polyp was found in the distal sigmoid                            colon, 20 cm from the anal verge. The polyp was                            semi-pedunculated. The polyp was removed with a hot                            snare. Resection and retrieval were complete.                           Rare (2-3) medium-mouthed diverticula were found in                            the sigmoid colon. The colon was quite torturous.                           Non-bleeding internal hemorrhoids were  found during                            retroflexion. The hemorrhoids were small and Grade                            I (internal hemorrhoids that do not prolapse).                           The terminal ileum appeared normal.                           The exam was otherwise without abnormality on                            direct and retroflexion views. Complications:            No immediate complications. Estimated Blood Loss:     Estimated blood loss: none. Impression:               - One 8 mm polyp in the distal sigmoid colon,                            removed with a hot snare. Resected and retrieved.                           - Minimal sigmoid diverticulosis. Fairly torturous                            colon.                           - Non-bleeding internal hemorrhoids.                           - The examined portion of the ileum was normal.                            - The examination was otherwise normal on direct                            and retroflexion views. Recommendation:           - Patient has a contact number available for                            emergencies. The signs and symptoms of potential                            delayed complications were discussed with the                            patient. Return to normal activities tomorrow.                            Written discharge instructions were provided to the  patient.                           - Resume previous diet.                           - Miralax 1 capful (17 grams) in 8 ounces of water                            PO daily. Likely constipation contributing to                            abdominal pain. Negative CT Abdo/pelvis.                           - Await pathology results.                           - Repeat colonoscopy for surveillance based on                            pathology results.                           - FU if still with problems.                           - The findings and recommendations were discussed                            with the patient's family. Lynann Bologna, MD 07/29/2023 11:56:11 AM This report has been signed electronically.

## 2023-07-29 NOTE — Progress Notes (Signed)
Chief Complaint: Ok Edwards pain  Referring Provider:  Lynann Bologna, MD      ASSESSMENT AND PLAN;   #1. LUQ pain- ?etiology- Neg CT AP  #2. CRC screening  Plan: -Colon    Discussed risks & benefits of colonoscopy. Risks including rare perforation req laparotomy, bleeding after bx/polypectomy req blood transfusion, rarely missing neoplasms, risks of anesthesia/sedation, rare risk of damage to internal organs. Benefits outweigh the risks. Patient agrees to proceed. All the questions were answered. Pt consents to proceed. HPI:    Douglas Stark is a 52 y.o. male   LUQ pain x 3-4 yrs, intermittent, at times sharp Getting worse Occ worse after eating but not always Not always relieved by Bms Gets worse towards the end of the day Better after lying down Worst with exercise  No N/V Used to hearburn-resolved when he stopped spicy foods He denies having any odynophagia or dysphagia.  No diarrhea or constipation.  No fever chills or night sweats.  No recent weight loss.  He does exercise and works out almost every day.  Denies having any definite food intolerance except spicy foods  No sodas, chocolates, chewing gums, artificial sweeteners and candy. 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  No alcohol or smoking     Past Medical History:  Diagnosis Date   Anxiety     Past Surgical History:  Procedure Laterality Date   VASOTOMY      Family History  Problem Relation Age of Onset   Liver disease Neg Hx    Esophageal cancer Neg Hx    Colon cancer Neg Hx    Rectal cancer Neg Hx    Stomach cancer Neg Hx     Social History   Tobacco Use   Smoking status: Never   Smokeless tobacco: Never  Vaping Use   Vaping status: Never Used  Substance Use Topics   Alcohol use: Yes    Alcohol/week: 1.0 standard drink of alcohol    Types: 1 Cans of beer per week    Comment: occ   Drug use: Never    Current Outpatient Medications  Medication Sig Dispense  Refill   MAGNESIUM PO daily.     traZODone (DESYREL) 100 MG tablet at bedtime.     hydrOXYzine (ATARAX/VISTARIL) 50 MG tablet Take 50 mg by mouth 2 (two) times daily. (Patient not taking: Reported on 05/20/2023)     Current Facility-Administered Medications  Medication Dose Route Frequency Provider Last Rate Last Admin   0.9 %  sodium chloride infusion  500 mL Intravenous Once Lynann Bologna, MD        Allergies  Allergen Reactions   Melatonin Palpitations    Unsure of this    Review of Systems:  Constitutional: Denies fever, chills, diaphoresis, appetite change and fatigue.  HEENT: Denies photophobia, eye pain, redness, hearing loss, ear pain, congestion, sore throat, rhinorrhea, sneezing, mouth sores, neck pain, neck stiffness and tinnitus.   Respiratory: Denies SOB, DOE, cough, chest tightness,  and wheezing.   Cardiovascular: Denies chest pain, palpitations and leg swelling.  Genitourinary: Denies dysuria, urgency, frequency, hematuria, flank pain and difficulty urinating.  Musculoskeletal: Denies myalgias, back pain, joint swelling, arthralgias and gait problem.  Skin: No rash.  Neurological: Denies dizziness, seizures, syncope, weakness, light-headedness, numbness and headaches.  Hematological: Denies adenopathy. Easy bruising, personal or family bleeding history  Psychiatric/Behavioral: No anxiety or depression     Physical Exam:    BP 117/79   Pulse 70  Temp 98.1 F (36.7 C)   Ht 5\' 8"  (1.727 m)   Wt 175 lb (79.4 kg)   SpO2 96%   BMI 26.61 kg/m  Wt Readings from Last 3 Encounters:  07/29/23 175 lb (79.4 kg)  05/20/23 175 lb (79.4 kg)  08/31/20 167 lb (75.8 kg)   Constitutional:  Well-developed, in no acute distress. Psychiatric: Normal mood and affect. Behavior is normal. HEENT: Pupils normal.  Conjunctivae are normal. No scleral icterus. Neck supple.  Cardiovascular: Normal rate, regular rhythm. No edema Pulmonary/chest: Effort normal and breath sounds  normal. No wheezing, rales or rhonchi. Abdominal: Soft, nondistended. Mild LUQ tenderness without rebound.  Bowel sounds active throughout. There are no masses palpable. No hepatomegaly. Rectal: Deferred Neurological: Alert and oriented to person place and time. Skin: Skin is warm and dry. No rashes noted.  Data Reviewed: I have personally reviewed following labs and imaging studies  CBC:    Latest Ref Rng & Units 06/26/2023   10:01 AM 08/13/2020    3:08 AM 08/20/2016    2:39 PM  CBC  WBC 4.0 - 10.5 K/uL 5.3  6.4  5.3   Hemoglobin 13.0 - 17.0 g/dL 16.1  09.6  04.5   Hematocrit 39.0 - 52.0 % 47.6  43.6  46.0   Platelets 150.0 - 400.0 K/uL 184.0  185  175     CMP:    Latest Ref Rng & Units 06/26/2023   10:01 AM 08/13/2020    3:08 AM 08/20/2016    5:04 PM  CMP  Glucose 70 - 99 mg/dL 96  409    BUN 6 - 23 mg/dL 17  23    Creatinine 8.11 - 1.50 mg/dL 9.14  7.82    Sodium 956 - 145 mEq/L 138  139    Potassium 3.5 - 5.1 mEq/L 3.8  3.8    Chloride 96 - 112 mEq/L 105  105    CO2 19 - 32 mEq/L 27  24    Calcium 8.4 - 10.5 mg/dL 8.9  9.3    Total Protein 6.0 - 8.3 g/dL 6.6   6.8   Total Bilirubin 0.2 - 1.2 mg/dL 0.5   0.7   Alkaline Phos 39 - 117 U/L 26   54   AST 0 - 37 U/L 16   32   ALT 0 - 53 U/L 15   36         [name], MD 07/29/2023, 11:20 AM  Cc: Lynann Bologna, MD

## 2023-07-29 NOTE — Progress Notes (Signed)
Vss nad trans to pacu 

## 2023-07-29 NOTE — Patient Instructions (Signed)
-  Handout on polyps, hemorrhoids and diverticulosis provided -await pathology results -repeat colonoscopy for surveillance recommended. Date to be determined when pathology result become available   -Continue present medications Miralax 1 capful ( 17 grams) in 8 oz of water daily. Can buy at pharmacy of choice.   YOU HAD AN ENDOSCOPIC PROCEDURE TODAY AT THE [facility]:   Refer to the procedure report that was given to you for any specific questions about what was found during the examination.  If the procedure report does not answer your questions, please call your gastroenterologist to clarify.  If you requested that your care partner not be given the details of your procedure findings, then the procedure report has been included in a sealed envelope for you to review at your convenience later.  YOU SHOULD EXPECT: Some feelings of bloating in the abdomen. Passage of more gas than usual.  Walking can help get rid of the air that was put into your GI tract during the procedure and reduce the bloating. If you had a lower endoscopy (such as a colonoscopy or flexible sigmoidoscopy) you may notice spotting of blood in your stool or on the toilet paper. If you underwent a bowel prep for your procedure, you may not have a normal bowel movement for a few days.  Please Note:  You might notice some irritation and congestion in your nose or some drainage.  This is from the oxygen used during your procedure.  There is no need for concern and it should clear up in a day or so.  SYMPTOMS TO REPORT IMMEDIATELY:  Following lower endoscopy (colonoscopy or flexible sigmoidoscopy):  Excessive amounts of blood in the stool  Significant tenderness or worsening of abdominal pains  Swelling of the abdomen that is new, acute  Fever of 100F or higher   For urgent or emergent issues, a gastroenterologist can be reached at any hour by calling [phone]. Do not use MyChart messaging for urgent concerns.     DIET:  We do recommend a small meal at first, but then you may proceed to your regular diet.  Drink plenty of fluids but you should avoid alcoholic beverages for 24 hours.  ACTIVITY:  You should plan to take it easy for the rest of today and you should NOT DRIVE or use heavy machinery until tomorrow (because of the sedation medicines used during the test).    FOLLOW UP: Our staff will call the number listed on your records the next business day following your procedure.  We will call around 7:15- 8:00 am to check on you and address any questions or concerns that you may have regarding the information given to you following your procedure. If we do not reach you, we will leave a message.     If any biopsies were taken you will be contacted by phone or by letter within the next 1-3 weeks.  Please call us at [phone] if you have not heard about the biopsies in 3 weeks.    SIGNATURES/CONFIDENTIALITY: You and/or your care partner have signed paperwork which will be entered into your electronic medical record.  These signatures attest to the fact that that the information above on your After Visit Summary has been reviewed and is understood.  Full responsibility of the confidentiality of this discharge information lies with you and/or your care-partner.

## 2023-07-30 ENCOUNTER — Telehealth: Payer: Self-pay

## 2023-07-30 NOTE — Telephone Encounter (Signed)
Follow up call to pt, lm for pt to call if having any difficulty with normal activities or eating and drinking.  Also to call if any other questions or concerns.  

## 2023-07-31 LAB — SURGICAL PATHOLOGY

## 2023-08-02 ENCOUNTER — Encounter: Payer: Self-pay | Admitting: Gastroenterology
